# Patient Record
Sex: Male | Born: 1970 | Race: Black or African American | Hispanic: No | Marital: Married | Smoking: Current every day smoker
Health system: Southern US, Community
[De-identification: ages and names within clinical notes are randomized; demographics above are authoritative.]

## PROBLEM LIST (undated history)

## (undated) DIAGNOSIS — T7840XA Allergy, unspecified, initial encounter: Secondary | ICD-10-CM

## (undated) HISTORY — DX: Allergy, unspecified, initial encounter: T78.40XA

## (undated) HISTORY — PX: TESTICLE SURGERY: SHX794

---

## 2008-04-07 ENCOUNTER — Ambulatory Visit: Payer: Self-pay | Admitting: Emergency Medicine

## 2008-04-07 ENCOUNTER — Emergency Department: Payer: Self-pay | Admitting: Emergency Medicine

## 2008-04-11 ENCOUNTER — Ambulatory Visit: Payer: Self-pay | Admitting: Vascular Surgery

## 2008-04-18 ENCOUNTER — Ambulatory Visit: Payer: Self-pay | Admitting: Vascular Surgery

## 2011-08-30 HISTORY — PX: GALLBLADDER SURGERY: SHX652

## 2012-04-10 ENCOUNTER — Ambulatory Visit: Payer: Self-pay | Admitting: Internal Medicine

## 2012-08-06 ENCOUNTER — Ambulatory Visit: Payer: Self-pay | Admitting: Internal Medicine

## 2014-05-17 IMAGING — CR DG CHEST 2V
1 series · 2 of 2 positions shown · non-contrast
Comparison: none

REASON FOR EXAM: chest pain
COMMENTS:

PROCEDURE:     DXR - DXR CHEST PA (OR AP) AND LATERAL  - August 06, 2012 [DATE]
RESULT:     The lungs are clear. The heart and pulmonary vessels are normal.
The bony and mediastinal structures are unremarkable. There is no effusion.
There is no pneumothorax or evidence of congestive failure.

[Series 1: w chest pa · 0.14mm/px · 2 of 2 slices shown]
[im 1/2]
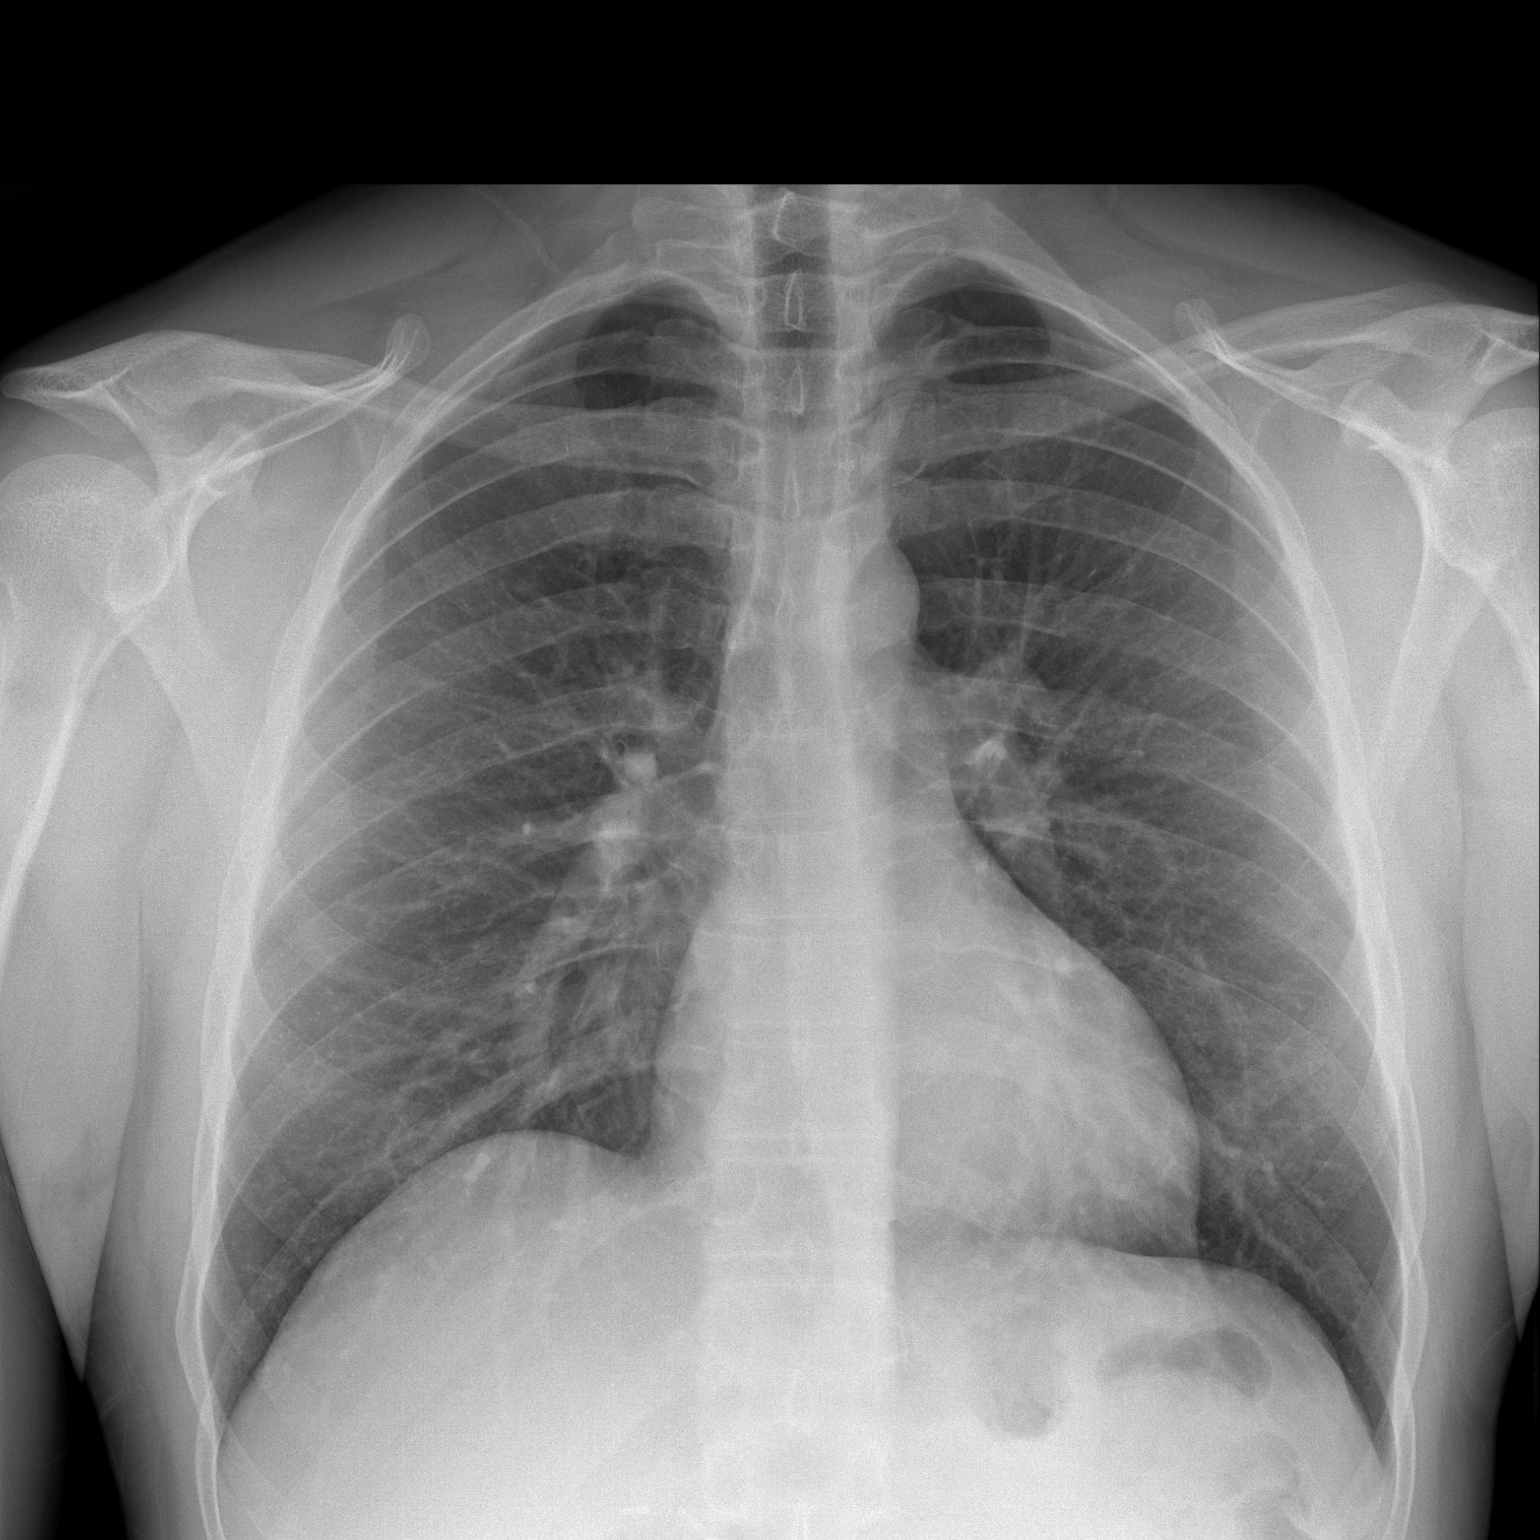
[im 2/2]
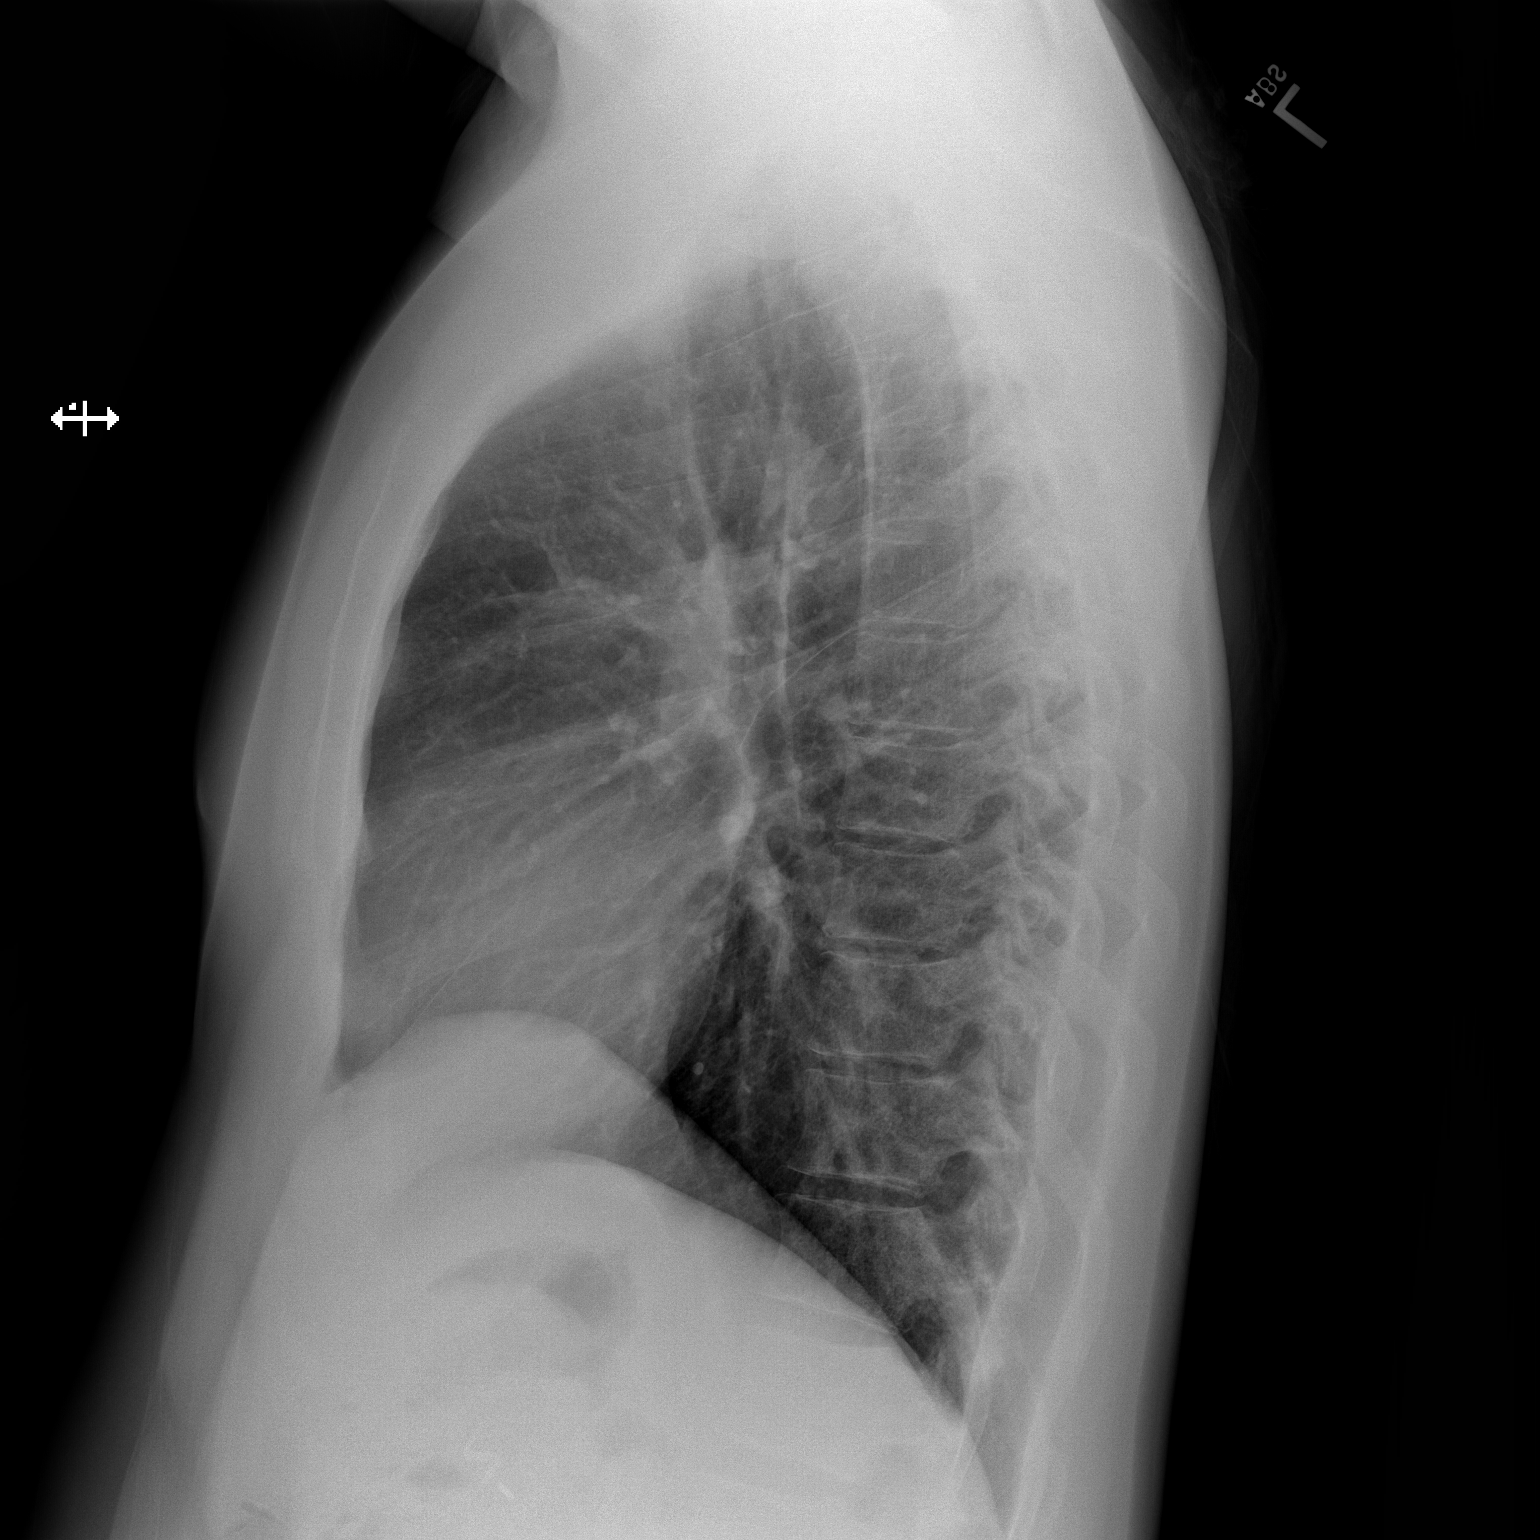

[2 of 2 positions shown; findings below may reference images not displayed]

IMPRESSION: No acute cardiopulmonary disease. Stable appearance
compared to 10 April, 2012.

[REDACTED]

## 2017-02-15 ENCOUNTER — Ambulatory Visit (INDEPENDENT_AMBULATORY_CARE_PROVIDER_SITE_OTHER): Payer: BLUE CROSS/BLUE SHIELD | Admitting: Family Medicine

## 2017-02-15 ENCOUNTER — Encounter: Payer: Self-pay | Admitting: Family Medicine

## 2017-02-15 DIAGNOSIS — F321 Major depressive disorder, single episode, moderate: Secondary | ICD-10-CM

## 2017-02-15 DIAGNOSIS — F32A Depression, unspecified: Secondary | ICD-10-CM | POA: Insufficient documentation

## 2017-02-15 DIAGNOSIS — F329 Major depressive disorder, single episode, unspecified: Secondary | ICD-10-CM | POA: Insufficient documentation

## 2017-02-15 MED ORDER — SERTRALINE HCL 50 MG PO TABS: ORAL_TABLET | ORAL | 0 refills | Status: DC

## 2017-02-15 NOTE — Progress Notes (Signed)
Patient: Kenneth Lowery. Male    DOB: 1971/01/06   46 y.o.   MRN: [id number] Visit Date: 02/15/2017  Today's Provider: Mila Merry, MD   Chief Complaint  Patient presents with  . Depression   Subjective:    Patient is here to discuss his symptoms of depression.  He states that every once in a while he has periods where he doesn't feel like wanting to live. He had once of these periods about two weeks ago and became very concerned. He did not formulate a specific plan but thought through the consequences such as the reaction of his family members finding his bodies. He doesn't usually feel depressed but sometimes feels like he goes through a funk where he feels sad and teary. He states he saw a counselor about this a few days ago and was advised to come in and talk about medications for depression. He states he had an aunt who attempted suicide and thinks that his grandmother may have been schizophrenic. He does not hear voices or see things that are not present.   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 He feels that everything in his home and work environment are going great with no recent changes.      No Known Allergies   Current Outpatient Prescriptions:  .  Chlorpheniramine Maleate (ALLERGY RELIEF PO), Take by mouth as needed., Disp: , Rfl:   Review of Systems  Constitutional: Negative for appetite change, chills and fever.  Respiratory: Negative for chest tightness, shortness of breath and wheezing.   Cardiovascular: Negative for chest pain and palpitations.  Gastrointestinal: Negative for abdominal pain, nausea and vomiting.    Social History  Substance Use Topics  . Smoking status: Current Every Day Smoker  . Smokeless tobacco: Never Used  . Alcohol use Yes   Objective:   BP 104/70 (BP Location: Right Arm, Patient Position: Sitting, Cuff Size: Large)   Pulse 80   Temp 98.5 F  (36.9 C) (Oral)   Resp 16   Ht 6' 2.5" (1.892 m)   Wt 208 lb (94.3 kg)   SpO2 99%   BMI 26.35 kg/m  Vitals:   02/15/17 0859  BP: 104/70  Pulse: 80  Resp: 16  Temp: 98.5 F (36.9 C)  TempSrc: Oral  SpO2: 99%  Weight: 208 lb (94.3 kg)  Height: 6' 2.5" (1.892 m)   Depression screen [facility] 2/9 02/15/2017  Decreased Interest 1  Down, Depressed, Hopeless 3  PHQ - 2 Score 4  Altered sleeping 1  Tired, decreased energy 0  Change in appetite 1  Feeling bad or failure about yourself  1  Trouble concentrating 0  Moving slowly or fidgety/restless 2  Suicidal thoughts 1  PHQ-9 Score 10  Difficult doing work/chores Somewhat difficult     Physical Exam  General appearance: alert, well developed, well nourished, cooperative and in no distress Head: Normocephalic, without obvious abnormality, atraumatic Respiratory: Respirations even and unlabored, normal respiratory rate Extremities: No gross deformities Skin: Skin color, texture, turgor normal. No rashes seen  Psych: Appropriate mood and affect. Neurologic: Mental status: Alert, oriented to person, place, and time, thought content appropriate.     Assessment & Plan:     1. Current moderate episode of major depressive disorder without prior episode [facility]) Not actively suicidal. Is seeing counselor and anticipates follow up after starting medications. Start sertraline and schedule  follow up here in 2-3 weeks.        Mila Merryonald Blayton Huttner, MD  [facility]

## 2017-03-13 ENCOUNTER — Encounter: Payer: Self-pay | Admitting: Family Medicine

## 2017-03-13 ENCOUNTER — Ambulatory Visit (INDEPENDENT_AMBULATORY_CARE_PROVIDER_SITE_OTHER): Payer: BLUE CROSS/BLUE SHIELD | Admitting: Family Medicine

## 2017-03-13 ENCOUNTER — Other Ambulatory Visit: Payer: Self-pay | Admitting: Family Medicine

## 2017-03-13 VITALS — BP 120/78 | HR 74 | Temp 98.9°F | Resp 16 | Wt 200.0 lb

## 2017-03-13 DIAGNOSIS — Z23 Encounter for immunization: Secondary | ICD-10-CM | POA: Diagnosis not present

## 2017-03-13 DIAGNOSIS — F321 Major depressive disorder, single episode, moderate: Secondary | ICD-10-CM

## 2017-03-13 MED ORDER — SERTRALINE HCL 50 MG PO TABS: 50.0000 mg | ORAL_TABLET | Freq: Every day | ORAL | 3 refills | Status: DC

## 2017-03-13 MED ORDER — SERTRALINE HCL 50 MG PO TABS: 50.0000 mg | ORAL_TABLET | Freq: Every day | ORAL | 6 refills | Status: DC

## 2017-03-13 NOTE — Progress Notes (Signed)
       Patient: Kenneth Leydendmond C Bahl Jr. Male    DOB: [birth year]   46 y.o.   MRN: [id number] Visit Date: 03/13/2017  Today's Provider: Mila Merryonald Tajanay Hurley, MD   Chief Complaint  Patient presents with  . Depression    3 week follow up   Subjective:    HPI Follow up of Depression: Patient was last seen for this problem 3 weeks ago. Management during that visit includes starting patient on Sertraline and advising patient to follow up in 2-3 weeks. Today patient reports good compliance with treatment, good tolerance and good symptom control.     No Known Allergies   Current Outpatient Prescriptions:  .  loratadine (CLARITIN) 10 MG tablet, Take 10 mg by mouth daily., Disp: , Rfl:  .  sertraline (ZOLOFT) 50 MG tablet, 1/2 tablet daily for six days, then increase to 1 tablet daily, Disp: 30 tablet, Rfl: 0  Review of Systems  Constitutional: Negative for appetite change, chills and fever.  Respiratory: Negative for chest tightness, shortness of breath and wheezing.   Cardiovascular: Negative for chest pain and palpitations.  Gastrointestinal: Negative for abdominal pain, nausea and vomiting.    Social History  Substance Use Topics  . Smoking status: Current Every Day Smoker    Packs/day: 1.00  . Smokeless tobacco: Never Used  . Alcohol use 3.6 - 7.2 oz/week    6 - 12 Cans of beer per week   Objective:   BP 120/78 (BP Location: Left Arm, Patient Position: Sitting, Cuff Size: Normal)   Pulse 74   Temp 98.9 F (37.2 C) (Oral)   Resp 16   Wt 200 lb (90.7 kg)   SpO2 98% Comment: room air  BMI 25.33 kg/m  There were no vitals filed for this visit.   Depression screen PHQ 2/9 03/13/2017  Decreased Interest 1  Down, Depressed, Hopeless 0  PHQ - 2 Score 1  Altered sleeping 2  Tired, decreased energy 1  Change in appetite 0  Feeling bad or failure about yourself  0  Trouble concentrating 0  Moving slowly or fidgety/restless 0  Suicidal thoughts 0  PHQ-9 Score 4  Difficult doing  work/chores Not difficult at all      Physical Exam   General appearance: alert, well developed, well nourished, cooperative and in no distress Head: Normocephalic, without obvious abnormality, atraumatic Respiratory: Respirations even and unlabored, normal respiratory rate Extremities: No gross deformities Skin: Skin color, texture, turgor normal. No rashes seen  Psych: Appropriate mood and affect. Neurologic: Mental status: Alert, oriented to person, place, and time, thought content appropriate.     Assessment & Plan:     1. Current moderate episode of major depressive disorder without prior episode (HCC) Doing well with sertraline. Continue current medications.  Follow up 6 months.   2. Need for tetanus, diphtheria, and acellular pertussis (Tdap) vaccine  - Tdap vaccine greater than or equal to 7yo IM       Mila Merryonald Jonelle Bann, MD  [facility]

## 2017-03-13 NOTE — Telephone Encounter (Signed)
[facility] faxed a request for a 90-days supply on the following medication. Thanks CC  sertraline (ZOLOFT) 50 MG tablet

## 2017-09-18 ENCOUNTER — Ambulatory Visit: Payer: Self-pay | Admitting: Family Medicine

## 2017-09-25 ENCOUNTER — Encounter: Payer: Self-pay | Admitting: Family Medicine

## 2017-09-25 ENCOUNTER — Ambulatory Visit (INDEPENDENT_AMBULATORY_CARE_PROVIDER_SITE_OTHER): Payer: BLUE CROSS/BLUE SHIELD | Admitting: Family Medicine

## 2017-09-25 VITALS — BP 110/64 | HR 81 | Temp 98.1°F | Resp 16 | Ht 75.0 in | Wt 204.0 lb

## 2017-09-25 DIAGNOSIS — F321 Major depressive disorder, single episode, moderate: Secondary | ICD-10-CM | POA: Diagnosis not present

## 2017-09-25 NOTE — Progress Notes (Signed)
       Patient: Kenneth Leydendmond C Knick Jr. Male    DOB: 1971-04-05   47 y.o.   MRN: [id number] Visit Date: 09/25/2017  Today's Provider: Mila Merryonald Fisher, MD   Chief Complaint  Patient presents with  . Follow-up  . Depression   Subjective:    HPI  Depression Since last visit has been doing fairly well but having some trouble sleeping  Feels his mood is pretty good most of the time. Gets a little withdrawn occasionally, but feels is doing much better since being on sertraline. Is not having any adverse effects.   No Known Allergies   Current Outpatient Medications:  .  loratadine (CLARITIN) 10 MG tablet, Take 10 mg by mouth daily., Disp: , Rfl:  .  sertraline (ZOLOFT) 50 MG tablet, Take 1 tablet (50 mg total) by mouth daily., Disp: 90 tablet, Rfl: 3  Review of Systems  Constitutional: Negative for appetite change, chills and fever.  Respiratory: Negative for chest tightness, shortness of breath and wheezing.   Cardiovascular: Negative for chest pain and palpitations.  Gastrointestinal: Negative for abdominal pain, nausea and vomiting.    Social History   Tobacco Use  . Smoking status: Current Every Day Smoker    Packs/day: 1.00  . Smokeless tobacco: Never Used  Substance Use Topics  . Alcohol use: Yes    Alcohol/week: 3.6 - 7.2 oz    Types: 6 - 12 Cans of beer per week   Objective:   BP 110/64 (BP Location: Right Arm, Patient Position: Sitting, Cuff Size: Large)   Pulse 81   Temp 98.1 F (36.7 C) (Oral)   Resp 16   Ht 6\' 3"  (1.905 m)   Wt 204 lb (92.5 kg)   SpO2 99%   BMI 25.50 kg/m  Vitals:   09/25/17 1441  BP: 110/64  Pulse: 81  Resp: 16  Temp: 98.1 F (36.7 C)  TempSrc: Oral  SpO2: 99%  Weight: 204 lb (92.5 kg)  Height: 6\' 3"  (1.905 m)     Physical Exam  General appearance: alert, well developed, well nourished, cooperative and in no distress Head: Normocephalic, without obvious abnormality, atraumatic Respiratory: Respirations even and unlabored,  normal respiratory rate Extremities: No gross deformities Skin: Skin color, texture, turgor normal. No rashes seen  Psych: Appropriate mood and affect. Neurologic: Mental status: Alert, oriented to person, place, and time, thought content appropriate.     Assessment & Plan:     1. Current moderate episode of major depressive disorder without prior episode (HCC) Doing well on current dose of sertraline. Continue current medications.   Follow up and CPE in 6 months.        Mila Merryonald Fisher, MD  [facility]

## 2017-11-16 ENCOUNTER — Other Ambulatory Visit: Payer: Self-pay | Admitting: Family Medicine

## 2017-11-16 NOTE — Telephone Encounter (Signed)
[facility] for a 90-days supply for the following medication. Thanks CC ° °sertraline (ZOLOFT) 50 MG tablet  ° °

## 2017-11-17 MED ORDER — SERTRALINE HCL 50 MG PO TABS: 50.0000 mg | ORAL_TABLET | Freq: Every day | ORAL | 3 refills | Status: DC

## 2018-03-02 ENCOUNTER — Ambulatory Visit: Payer: Self-pay | Admitting: Family Medicine

## 2018-03-07 ENCOUNTER — Encounter: Payer: Self-pay | Admitting: Family Medicine

## 2018-03-07 ENCOUNTER — Ambulatory Visit (INDEPENDENT_AMBULATORY_CARE_PROVIDER_SITE_OTHER): Payer: BLUE CROSS/BLUE SHIELD | Admitting: Family Medicine

## 2018-03-07 VITALS — BP 140/80 | HR 74 | Temp 98.6°F | Resp 16 | Wt 205.4 lb

## 2018-03-07 DIAGNOSIS — Z113 Encounter for screening for infections with a predominantly sexual mode of transmission: Secondary | ICD-10-CM | POA: Diagnosis not present

## 2018-03-07 DIAGNOSIS — Z13220 Encounter for screening for lipoid disorders: Secondary | ICD-10-CM

## 2018-03-07 DIAGNOSIS — Z13228 Encounter for screening for other metabolic disorders: Secondary | ICD-10-CM

## 2018-03-07 NOTE — Progress Notes (Signed)
       Patient: Kenneth Leydendmond C Joles Jr. Male    DOB: 08-Mar-1971   47 y.o.   MRN: [id number] Visit Date: 03/07/2018  Today's Provider: Mila Merryonald Fisher, MD   Chief Complaint  Patient presents with  . Exposure to STD   Subjective:    HPI Sexually Transmitted Disease Check: Patient presents for sexually transmitted disease check. Sexual history reviewed with the patient. STD exposure: He reports partner had blood test done Positive for HSV and he is concerned he may have it.   Previous history of STD:  He denies any dysuria, discharge, or genital or oral lesions.  Contraception: vasectomy.    No Known Allergies   Current Outpatient Medications:  .  loratadine (CLARITIN) 10 MG tablet, Take 10 mg by mouth daily., Disp: , Rfl:  .  sertraline (ZOLOFT) 50 MG tablet, Take 1 tablet (50 mg total) by mouth daily., Disp: 90 tablet, Rfl: 3  Review of Systems  Cardiovascular: Negative for chest pain, palpitations and leg swelling.    Social History   Tobacco Use  . Smoking status: Current Every Day Smoker    Packs/day: 1.00  . Smokeless tobacco: Never Used  Substance Use Topics  . Alcohol use: Yes    Alcohol/week: 3.6 - 7.2 oz    Types: 6 - 12 Cans of beer per week   Objective:   BP 140/80 (BP Location: Right Arm, Patient Position: Sitting, Cuff Size: Large) Comment: "nervous"  Pulse 74   Temp 98.6 F (37 C) (Oral)   Resp 16   Wt 205 lb 6.4 oz (93.2 kg)   SpO2 99%   BMI 25.67 kg/m  Vitals:   03/07/18 1441  BP: 140/80  Pulse: 74  Resp: 16  Temp: 98.6 F (37 C)  TempSrc: Oral  SpO2: 99%  Weight: 205 lb 6.4 oz (93.2 kg)     Physical Exam  General appearance: alert, well developed, well nourished, cooperative and in no distress Head: Normocephalic, without obvious abnormality, atraumatic Respiratory: Respirations even and unlabored, normal respiratory rate Extremities: No gross deformities Skin: Skin color, texture, turgor normal. No rashes seen  Psych: Appropriate mood  and affect. Neurologic: Mental status: Alert, oriented to person, place, and time, thought content appropriate.     Assessment & Plan:     1. Screening examination for STD (sexually transmitted disease) Counseled on high prevalence of hxv I and II and hsv I is historically associated with oral lesions and I with genital lesions, although there are now fairly common exceptions. Considering no current or previous symptoms that no treatment is necessary for a positive antibiotic test, but prophylaxis might be worth considering if hsv II is positive.  - HIV antibody (with reflex) - RPR - HSV(herpes simplex vrs) 1+2 ab-IgG - GC/Chlamydia Probe Amp([facility])  2. Lipid screening - Lipid panel  3. Screening for metabolic disorder  - Comprehensive metabolic panel       Mila Merryonald Fisher, MD  [facility]

## 2018-03-08 LAB — LIPID PANEL
CHOL/HDL RATIO: 4.2 ratio (ref 0.0–5.0)
Cholesterol, Total: 190 mg/dL (ref 100–199)
HDL: 45 mg/dL (ref >39)
LDL CALC: 122 mg/dL — AB (ref 0–99)
Triglycerides: 114 mg/dL (ref 0–149)
VLDL Cholesterol Cal: 23 mg/dL (ref 5–40)

## 2018-03-08 LAB — COMPREHENSIVE METABOLIC PANEL
A/G RATIO: 1.5 (ref 1.2–2.2)
ALK PHOS: 79 IU/L (ref 39–117)
ALT: 12 IU/L (ref 0–44)
AST: 12 IU/L (ref 0–40)
Albumin: 4.2 g/dL (ref 3.5–5.5)
BUN/Creatinine Ratio: 12 (ref 9–20)
BUN: 11 mg/dL (ref 6–24)
CHLORIDE: 101 mmol/L (ref 96–106)
CO2: 27 mmol/L (ref 20–29)
Calcium: 9.9 mg/dL (ref 8.7–10.2)
Creatinine, Ser: 0.91 mg/dL (ref 0.76–1.27)
GFR calc Af Amer: 118 mL/min/{1.73_m2} (ref >59)
GFR calc non Af Amer: 102 mL/min/{1.73_m2} (ref >59)
GLOBULIN, TOTAL: 2.8 g/dL (ref 1.5–4.5)
Glucose: 90 mg/dL (ref 65–99)
Potassium: 4.4 mmol/L (ref 3.5–5.2)
SODIUM: 141 mmol/L (ref 134–144)
Total Protein: 7 g/dL (ref 6.0–8.5)

## 2018-03-08 LAB — RPR: RPR: NONREACTIVE

## 2018-03-08 LAB — HSV(HERPES SIMPLEX VRS) I + II AB-IGG: HSV 1 GLYCOPROTEIN G AB, IGG: 1.26 {index} — AB (ref 0.00–0.90)

## 2018-03-08 LAB — HIV ANTIBODY (ROUTINE TESTING W REFLEX): HIV SCREEN 4TH GENERATION: NONREACTIVE

## 2018-03-09 LAB — GC/CHLAMYDIA PROBE AMP
CHLAMYDIA, DNA PROBE: NEGATIVE
Neisseria gonorrhoeae by PCR: NEGATIVE

## 2018-03-26 ENCOUNTER — Encounter: Payer: Self-pay | Admitting: Family Medicine

## 2018-04-04 ENCOUNTER — Ambulatory Visit (INDEPENDENT_AMBULATORY_CARE_PROVIDER_SITE_OTHER): Payer: BLUE CROSS/BLUE SHIELD | Admitting: Family Medicine

## 2018-04-04 ENCOUNTER — Encounter: Payer: Self-pay | Admitting: Family Medicine

## 2018-04-04 VITALS — BP 110/82 | HR 73 | Temp 98.8°F | Resp 15 | Ht 74.75 in | Wt 205.2 lb

## 2018-04-04 DIAGNOSIS — F321 Major depressive disorder, single episode, moderate: Secondary | ICD-10-CM

## 2018-04-04 DIAGNOSIS — Z8619 Personal history of other infectious and parasitic diseases: Secondary | ICD-10-CM | POA: Insufficient documentation

## 2018-04-04 DIAGNOSIS — Z0000 Encounter for general adult medical examination without abnormal findings: Secondary | ICD-10-CM | POA: Diagnosis not present

## 2018-04-04 DIAGNOSIS — Z720 Tobacco use: Secondary | ICD-10-CM | POA: Diagnosis not present

## 2018-04-04 MED ORDER — BUPROPION HCL ER (SR) 100 MG PO TB12: 100.0000 mg | ORAL_TABLET | Freq: Two times a day (BID) | ORAL | 3 refills | Status: DC

## 2018-04-04 NOTE — Patient Instructions (Addendum)
Please stop smoking   Preventive Care 40-64 Years, Male Preventive care refers to lifestyle choices and visits with your health care provider that can promote health and wellness. What does preventive care include?  A yearly physical exam. This is also called an annual well check.  Dental exams once or twice a year.  Routine eye exams. Ask your health care provider how often you should have your eyes checked.  Personal lifestyle choices, including: ? Daily care of your teeth and gums. ? Regular physical activity. ? Eating a healthy diet. ? Avoiding tobacco and drug use. ? Limiting alcohol use. ? Practicing safe sex. ? Taking low-dose aspirin every day starting at age 45. What happens during an annual well check? The services and screenings done by your health care provider during your annual well check will depend on your age, overall health, lifestyle risk factors, and family history of disease. Counseling Your health care provider may ask you questions about your:  Alcohol use.  Tobacco use.  Drug use.  Emotional well-being.  Home and relationship well-being.  Sexual activity.  Eating habits.  Work and work Statistician.  Screening You may have the following tests or measurements:  Height, weight, and BMI.  Blood pressure.  Lipid and cholesterol levels. These may be checked every 5 years, or more frequently if you are over 67 years old.  Skin check.  Lung cancer screening. You may have this screening every year starting at age 45 if you have a 30-pack-year history of smoking and currently smoke or have quit within the past 15 years.  Fecal occult blood test (FOBT) of the stool. You may have this test every year starting at age 45.  Flexible sigmoidoscopy or colonoscopy. You may have a sigmoidoscopy every 5 years or a colonoscopy every 10 years starting at age 45.  Prostate cancer screening. Recommendations will vary depending on your family history and  other risks.  Hepatitis C blood test.  Hepatitis B blood test.  Sexually transmitted disease (STD) testing.  Diabetes screening. This is done by checking your blood sugar (glucose) after you have not eaten for a while (fasting). You may have this done every 1-3 years.  Discuss your test results, treatment options, and if necessary, the need for more tests with your health care provider. Vaccines Your health care provider may recommend certain vaccines, such as:  Influenza vaccine. This is recommended every year.  Tetanus, diphtheria, and acellular pertussis (Tdap, Td) vaccine. You may need a Td booster every 10 years.  Varicella vaccine. You may need this if you have not been vaccinated.  Zoster vaccine. You may need this after age 45.  Measles, mumps, and rubella (MMR) vaccine. You may need at least one dose of MMR if you were born in 1957 or later. You may also need a second dose.  Pneumococcal 13-valent conjugate (PCV13) vaccine. You may need this if you have certain conditions and have not been vaccinated.  Pneumococcal polysaccharide (PPSV23) vaccine. You may need one or two doses if you smoke cigarettes or if you have certain conditions.  Meningococcal vaccine. You may need this if you have certain conditions.  Hepatitis A vaccine. You may need this if you have certain conditions or if you travel or work in places where you may be exposed to hepatitis A.  Hepatitis B vaccine. You may need this if you have certain conditions or if you travel or work in places where you may be exposed to hepatitis B.  Haemophilus influenzae type b (Hib) vaccine. You may need this if you have certain risk factors.  Talk to your health care provider about which screenings and vaccines you need and how often you need them. This information is not intended to replace advice given to you by your health care provider. Make sure you discuss any questions you have with your health care  provider. Document Released: 09/11/2015 Document Revised: 05/04/2016 Document Reviewed: 06/16/2015 Elsevier Interactive Patient Education  [employer].

## 2018-04-04 NOTE — Progress Notes (Signed)
Patient: Kenneth Lowery., Male    DOB: [birth year], 47 y.o.   MRN: [id number] Visit Date: 04/04/2018  Today's Provider: Mila Merry, MD   Chief Complaint  Patient presents with  . Annual Exam   Subjective:    Annual physical exam Kenneth Griep. is a 47 y.o. male who presents today for health maintenance and complete physical. He feels well. He reports not actively exercising . He reports he is sleeping poorly on average patient does sleep 6hrs a night.  -----------------------------------------------------------------  Current moderate episode of major depressive disorder without prior episode (HCC) From 09/25/2017-no changes. Doing well on current dose of sertraline.    He does drink 3-4 alcoholic drinks most days and has trouble sleeping when he stops. He also continue to smoke 1 ppd and vapes as an attempt to cut back on cigarettes. He states he tried Chantix in the past which didn't really help and caused vivid dreams.   Results for orders placed or performed in visit on 03/07/18  Comprehensive metabolic panel  Result Value Ref Range   Glucose 90 65 - 99 mg/dL   BUN 11 6 - 24 mg/dL   Creatinine, Ser 6.57 0.76 - 1.27 mg/dL   GFR calc non Af Amer 102 >59 mL/min/1.73   GFR calc Af Amer 118 >59 mL/min/1.73   BUN/Creatinine Ratio 12 9 - 20   Sodium 141 134 - 144 mmol/L   Potassium 4.4 3.5 - 5.2 mmol/L   Chloride 101 96 - 106 mmol/L   CO2 27 20 - 29 mmol/L   Calcium 9.9 8.7 - 10.2 mg/dL   Total Protein 7.0 6.0 - 8.5 g/dL   Albumin 4.2 3.5 - 5.5 g/dL   Globulin, Total 2.8 1.5 - 4.5 g/dL   Albumin/Globulin Ratio 1.5 1.2 - 2.2   Bilirubin Total <0.2 0.0 - 1.2 mg/dL   Alkaline Phosphatase 79 39 - 117 IU/L   AST 12 0 - 40 IU/L   ALT 12 0 - 44 IU/L  Lipid panel  Result Value Ref Range   Cholesterol, Total 190 100 - 199 mg/dL   Triglycerides 846 0 - 149 mg/dL   HDL 45 >96 mg/dL   VLDL Cholesterol Cal 23 5 - 40 mg/dL   LDL Calculated 295 (H) 0 - 99 mg/dL     Chol/HDL Ratio 4.2 0.0 - 5.0 ratio       Review of Systems  Constitutional: Negative.   HENT: Negative.   Eyes: Negative.   Respiratory: Negative.   Cardiovascular: Negative.   Gastrointestinal: Negative.   Endocrine: Negative.   Genitourinary: Negative.   Musculoskeletal: Positive for arthralgias and neck stiffness.  Skin: Negative.   Allergic/Immunologic: Negative.   Neurological: Negative.   Hematological: Negative.   Psychiatric/Behavioral: Positive for sleep disturbance. The patient is nervous/anxious.     Social History      He  reports that he has been smoking.  He has been smoking about 1.00 pack per day. He has never used smokeless tobacco. He reports that he drinks about 3.6 - 7.2 oz of alcohol per week. He reports that he does not use drugs.       Social History   Socioeconomic History  . Marital status: Married    Spouse name: Not on file  . Number of children: Not on file  . Years of education: Not on file  . Highest education level: Not on file  Occupational History  . Occupation: employed  Social Needs  . Financial resource strain: Not on file  . Food insecurity:    Worry: Not on file    Inability: Not on file  . Transportation needs:    Medical: Not on file    Non-medical: Not on file  Tobacco Use  . Smoking status: Current Every Day Smoker    Packs/day: 1.00  . Smokeless tobacco: Never Used  Substance and Sexual Activity  . Alcohol use: Yes    Alcohol/week: 3.6 - 7.2 oz    Types: 6 - 12 Cans of beer per week  . Drug use: No  . Sexual activity: Not on file  Lifestyle  . Physical activity:    Days per week: Not on file    Minutes per session: Not on file  . Stress: Not on file  Relationships  . Social connections:    Talks on phone: Not on file    Gets together: Not on file    Attends religious service: Not on file    Active member of club or organization: Not on file    Attends meetings of clubs or organizations: Not on file     Relationship status: Not on file  Other Topics Concern  . Not on file  Social History Narrative  . Not on file    Past Medical History:  Diagnosis Date  . Allergy      Patient Active Problem List   Diagnosis Date Noted  . NO HISTORY OF CHICKEN POX 04/04/2018  . Depression 02/15/2017    Past Surgical History:  Procedure Laterality Date  . GALLBLADDER SURGERY  2013  . TESTICLE SURGERY      Family History        Family Status  Relation Name Status  . Mother  Alive  . Other  Other       grandparents history, patient did not specify which grandparent had which illness.        His family history includes Cancer in his mother and other; Diabetes in his other; Mental illness in his other; Stroke in his other.      Allergies  Allergen Reactions  . Other Hives     Current Outpatient Medications:  .  loratadine (CLARITIN) 10 MG tablet, Take 10 mg by mouth daily., Disp: , Rfl:  .  sertraline (ZOLOFT) 50 MG tablet, Take 1 tablet (50 mg total) by mouth daily., Disp: 90 tablet, Rfl: 3   Patient Care Team: Malva LimesFisher, Donald E, MD as PCP - General (Family Medicine)      Objective:   Vitals: BP 110/82   Pulse 73   Temp 98.8 F (37.1 C) (Oral)   Resp 15   Ht 6' 2.75" (1.899 m)   Wt 205 lb 3.2 oz (93.1 kg)   SpO2 99%   BMI 25.82 kg/m    Vitals:   04/04/18 1406  BP: 110/82  Pulse: 73  Resp: 15  Temp: 98.8 F (37.1 C)  TempSrc: Oral  SpO2: 99%  Weight: 205 lb 3.2 oz (93.1 kg)  Height: 6' 2.75" (1.899 m)     Physical Exam   General Appearance:    Alert, cooperative, no distress, appears stated age  Head:    Normocephalic, without obvious abnormality, atraumatic  Eyes:    PERRL, conjunctiva/corneas clear, EOM's intact, fundi    benign, both eyes       Ears:    Normal TM's and external ear canals, both ears  Nose:   Nares normal, septum midline,  mucosa normal, no drainage   or sinus tenderness  Throat:   Lips, mucosa, and tongue normal; teeth and gums normal    Neck:   Supple, symmetrical, trachea midline, no adenopathy;       thyroid:  No enlargement/tenderness/nodules; no carotid   bruit or JVD  Back:     Symmetric, no curvature, ROM normal, no CVA tenderness  Lungs:     Clear to auscultation bilaterally, respirations unlabored  Chest wall:    No tenderness or deformity  Heart:    Regular rate and rhythm, S1 and S2 normal, no murmur, rub   or gallop  Abdomen:     Soft, non-tender, bowel sounds active all four quadrants,    no masses, no organomegaly  Genitalia:    deferred  Rectal:    deferred  Extremities:   Extremities normal, atraumatic, no cyanosis or edema  Pulses:   2+ and symmetric all extremities  Skin:   Skin color, texture, turgor normal, no rashes or lesions  Lymph nodes:   Cervical, supraclavicular, and axillary nodes normal  Neurologic:   CNII-XII intact. Normal strength, sensation and reflexes      throughout     Depression Screen PHQ 2/9 Scores 04/04/2018 03/13/2017 03/13/2017 02/15/2017  PHQ - 2 Score 2 1 1 4   PHQ- 9 Score 8 4 - 10      Assessment & Plan:     Routine Health Maintenance and Physical Exam  Exercise Activities and Dietary recommendations Goals    None      Immunization History  Administered Date(s) Administered  . Tdap 03/13/2017    Health Maintenance  Topic Date Due  . INFLUENZA VACCINE  03/29/2018  . TETANUS/TDAP  03/14/2027  . HIV Screening  Completed     Discussed health benefits of physical activity, and encouraged him to engage in regular exercise appropriate for his age and condition.    --------------------------------------------------------------------  1. Annual physical exam   2. Current moderate episode of major depressive disorder without prior episode (HCC) Doing well with sertraline.   3. Tobacco use Did not have success with chantix. He states he is not ready to quit yet, but accepted printed prescription for - buPROPion (WELLBUTRIN SR) 100 MG 12 hr tablet; Take 1  tablet (100 mg total) by mouth 2 (two) times daily.  Dispense: 60 tablet; Refill: 3 Counseled on health benefits of smoking cessation and the sooner he quits the better.    Mila Merry, MD  [facility]

## 2018-09-18 ENCOUNTER — Encounter: Payer: Self-pay | Admitting: Family Medicine

## 2018-09-18 ENCOUNTER — Ambulatory Visit (INDEPENDENT_AMBULATORY_CARE_PROVIDER_SITE_OTHER): Payer: BLUE CROSS/BLUE SHIELD | Admitting: Family Medicine

## 2018-09-18 VITALS — BP 120/70 | HR 100 | Temp 99.6°F | Resp 16 | Wt 210.2 lb

## 2018-09-18 DIAGNOSIS — Z20828 Contact with and (suspected) exposure to other viral communicable diseases: Secondary | ICD-10-CM

## 2018-09-18 DIAGNOSIS — B349 Viral infection, unspecified: Secondary | ICD-10-CM | POA: Diagnosis not present

## 2018-09-18 MED ORDER — HYDROCODONE-HOMATROPINE 5-1.5 MG/5ML PO SYRP: ORAL_SOLUTION | ORAL | 0 refills | Status: DC

## 2018-09-18 MED ORDER — OSELTAMIVIR PHOSPHATE 75 MG PO CAPS: 75.0000 mg | ORAL_CAPSULE | Freq: Two times a day (BID) | ORAL | 0 refills | Status: DC

## 2018-09-18 NOTE — Progress Notes (Signed)
  Subjective:     Patient ID: Kenneth Lowery., male   DOB: December 11, 1970, 48 y.o.   MRN: [id number] Chief Complaint  Patient presents with  . Influenza    Patient comes in office today with concerns of exposure to flu virus. Patient states that 2 days ago his child was diagnosed with both strains of flu and reports that this morning he woke up with a fever of 101. Patient reports today feeling fatigued and drained,patient did not recieve his flu vaccine this past year.    HPI Reports cough, body aches, sore throat, nd headache. No flu shot this season.  Review of Systems     Objective:   Physical Exam Constitutional:      General: He is not in acute distress.    Appearance: He is ill-appearing ( lying on exam table on presentation).  Neurological:     Mental Status: He is alert.   Ears: T.M's intact without inflammation Throat: no tonsillar enlargement or exudate Neck: no cervical adenopathy Lungs: clear     Assessment:    1. Acute viral syndrome; Rx for hydrocodone cough syrup  2. Exposure to influenza: rx for Tamiflu    Plan:    Discussed use of Mucinex D and Deslym Work excuse for 1/21-1/24/20.

## 2018-09-18 NOTE — Patient Instructions (Addendum)
Discussed use of Mucinex D for congestion. May also use Delysm for cough. Advil or Aleve for body aches.

## 2018-11-18 ENCOUNTER — Other Ambulatory Visit: Payer: Self-pay | Admitting: Family Medicine

## 2019-11-29 ENCOUNTER — Other Ambulatory Visit: Payer: Self-pay | Admitting: Family Medicine

## 2019-11-29 ENCOUNTER — Telehealth: Payer: Self-pay | Admitting: Family Medicine

## 2019-11-29 NOTE — Telephone Encounter (Signed)
Courtesy refill Patient needs appointment  

## 2019-11-29 NOTE — Telephone Encounter (Signed)
Attempted to contact patient to schedule appointment for medication refill. Left VM to call office to schedule.

## 2019-12-26 ENCOUNTER — Other Ambulatory Visit: Payer: Self-pay | Admitting: Family Medicine

## 2019-12-26 NOTE — Telephone Encounter (Signed)
Requested medication (s) are due for refill today -yes  Requested medication (s) are on the active medication list -yes  Future visit scheduled -no  Last refill: 11/29/19  Notes to clinic: Attempted to call patient - mailbox full and can not leave message- may need letter. Patient was given courtesy with last RF- sent for review  Requested Prescriptions  Pending Prescriptions Disp Refills   sertraline (ZOLOFT) 50 MG tablet [Pharmacy Med Name: SERTRALINE HCL 50 MG TABLET] 30 tablet 0    Sig: TAKE 1 TABLET BY MOUTH EVERY DAY      Psychiatry:  Antidepressants - SSRI Failed - 12/26/2019  8:16 AM      Failed - Completed PHQ-2 or PHQ-9 in the last 360 days.      Failed - Valid encounter within last 6 months    Recent Outpatient Visits           1 year ago Acute viral syndrome   [facility] [place], [place], [place]   1 year ago Annual physical exam   [facility] Malva Limes, MD   1 year ago Screening examination for STD (sexually transmitted disease)   [facility] Malva Limes, MD   2 years ago Current moderate episode of major depressive disorder without prior episode [facility])   [facility] Malva Limes, MD   2 years ago Current moderate episode of major depressive disorder without prior episode [facility])   [facility] Malva Limes, MD                  Requested Prescriptions  Pending Prescriptions Disp Refills   sertraline (ZOLOFT) 50 MG tablet [Pharmacy Med Name: SERTRALINE HCL 50 MG TABLET] 30 tablet 0    Sig: TAKE 1 TABLET BY MOUTH EVERY DAY      Psychiatry:  Antidepressants - SSRI Failed - 12/26/2019  8:16 AM      Failed - Completed PHQ-2 or PHQ-9 in the last 360 days.      Failed - Valid encounter within last 6 months    Recent Outpatient Visits           1 year ago Acute viral syndrome   [facility] [place], [place], [place]   1 year ago Annual physical exam   [facility] Malva Limes, MD   1 year ago Screening examination for STD (sexually transmitted disease)   [facility] Malva Limes, MD   2 years ago Current moderate episode of major depressive disorder without prior episode [facility])   [facility] Malva Limes, MD   2 years ago Current moderate episode of major depressive disorder without prior episode [facility])   [facility] Malva Limes, MD

## 2020-01-13 ENCOUNTER — Other Ambulatory Visit: Payer: Self-pay | Admitting: Family Medicine

## 2020-01-13 NOTE — Telephone Encounter (Signed)
Refill request for Sertraline; last refill 11/29/19; no valid encounter within last 6 months; no upcoming visits noted; pt notified;decision tree completed; pt offered and accepted virtual appt for med refills 01/29/20 at 0820; he verbalized understanding; 30 day courtesy refill granted; will route to office for notification. Requested Prescriptions  Pending Prescriptions Disp Refills  . sertraline (ZOLOFT) 50 MG tablet [Pharmacy Med Name: SERTRALINE HCL 50 MG TABLET] 30 tablet 0    Sig: TAKE 1 TABLET BY MOUTH EVERY DAY     Psychiatry:  Antidepressants - SSRI Failed - 01/13/2020  8:00 AM      Failed - Completed PHQ-2 or PHQ-9 in the last 360 days.      Failed - Valid encounter within last 6 months    Recent Outpatient Visits          1 year ago Acute viral syndrome   [facility] [place], [place], [place]   1 year ago Annual physical exam   [facility] Malva Limes, MD   1 year ago Screening examination for STD (sexually transmitted disease)   [facility] Malva Limes, MD   2 years ago Current moderate episode of major depressive disorder without prior episode [facility])   [facility] Malva Limes, MD   2 years ago Current moderate episode of major depressive disorder without prior episode [facility])   [facility] Malva Limes, MD

## 2020-01-15 MED ORDER — SERTRALINE HCL 50 MG PO TABS: 50.0000 mg | ORAL_TABLET | Freq: Every day | ORAL | 0 refills | Status: DC

## 2020-01-15 NOTE — Addendum Note (Signed)
Addended by: Malva Limes on: 01/15/2020 02:46 PM   Modules accepted: Orders

## 2020-01-28 ENCOUNTER — Encounter: Payer: Self-pay | Admitting: Family Medicine

## 2020-01-28 NOTE — Progress Notes (Signed)
MyChart Video Visit    Virtual Visit via Video Note   This visit type was conducted due to national recommendations for restrictions regarding the COVID-19 Pandemic (e.g. social distancing) in an effort to limit this patient's exposure and mitigate transmission in our community. This patient is at least at moderate risk for complications without adequate follow up. This format is felt to be most appropriate for this patient at this time. Physical exam was limited by quality of the video and audio technology used for the visit.   Patient location: home Provider location: b[facility]   Patient: Kenneth Lowery.   DOB: Feb 02, 1971   49 y.o. Male  MRN: [id number] Visit Date: 01/29/2020  Today's healthcare provider: Lelon Huh, MD   Chief Complaint  Patient presents with  . Depression   Subjective    HPI Depression, Follow-up  He  was last seen for this 04/04/2018.  Changes made at last visit include continuing same dose of Sertraline.   He reports good compliance with treatment. He is not having side effects.   He reports good tolerance of treatment. Current symptoms include: depressed mood, difficulty concentrating and fatigue He feels he is Improved since last visit.  Depression screen [facility] 2/9 01/28/2020 04/04/2018 03/13/2017  Decreased Interest 1 1 1   Down, Depressed, Hopeless 1 1 0  PHQ - 2 Score 2 2 1   Altered sleeping 0 1 2  Tired, decreased energy 1 1 1   Change in appetite 1 1 0  Feeling bad or failure about yourself  1 1 0  Trouble concentrating 1 2 0  Moving slowly or fidgety/restless 1 0 0  Suicidal thoughts 1 0 0  PHQ-9 Score 8 8 4   Difficult doing work/chores Somewhat difficult Not difficult at all Not difficult at all    ----------------------------------------------------------------------------------------- He does continue to smoke 1 ppd, although he sometimes changes to vaping during which he doesn't smoke as many cigarettes. He was previously prescribed  bupropion but did not fill prescription. He is interested in trying medication now.   Right eye is watering a lot the last week. Warm compress helps. A little itchy. No other drainage. No other cold sx.    Social History   Tobacco Use  . Smoking status: Current Every Day Smoker    Packs/day: 1.00  . Smokeless tobacco: Never Used  . Tobacco comment: started age 69.  Substance Use Topics  . Alcohol use: Yes    Alcohol/week: 6.0 - 12.0 standard drinks    Types: 6 - 12 Cans of beer per week  . Drug use: No      Medications: Outpatient Medications Prior to Visit  Medication Sig  . loratadine (CLARITIN) 10 MG tablet Take 10 mg by mouth daily.  . sertraline (ZOLOFT) 50 MG tablet Take 1 tablet (50 mg total) by mouth daily.  . [DISCONTINUED] oseltamivir (TAMIFLU) 75 MG capsule Take 1 capsule (75 mg total) by mouth 2 (two) times daily.  Marland Kitchen buPROPion (WELLBUTRIN SR) 100 MG 12 hr tablet Take 1 tablet (100 mg total) by mouth 2 (two) times daily. (Patient not taking: Reported on 01/28/2020)  . [DISCONTINUED] HYDROcodone-homatropine (HYCODAN) 5-1.5 MG/5ML syrup 5 ml 4-6 hours as needed for cough (Patient not taking: Reported on 01/28/2020)   No facility-administered medications prior to visit.    Review of Systems  Constitutional: Positive for fatigue. Negative for appetite change, chills and fever.  Respiratory: Negative for chest tightness, shortness of breath and wheezing.   Cardiovascular: Negative for  chest pain and palpitations.  Gastrointestinal: Negative for abdominal pain, nausea and vomiting.  Psychiatric/Behavioral: Positive for decreased concentration and dysphoric mood.      Objective    There were no vitals taken for this visit.  Physical Exam   Awake, alert, oriented x 3. In no apparent distress   Assessment & Plan     1. Current moderate episode of major depressive disorder without prior episode [facility]) Doing very well current medications. refill - sertraline (ZOLOFT)  50 MG tablet; Take 1 tablet (50 mg total) by mouth daily.  Dispense: 90 tablet; Refill: 3  2. Tobacco use He is now interested in trial of - buPROPion (WELLBUTRIN SR) 150 MG 12 hr tablet; 1 tablet daily for 3 days, then 1 tablet twice daily. Stop smoking 14 days after starting medication  Dispense: 60 tablet; Refill: 5  3. Acute viral conjunctivitis of right eye Is improving with warm compresses. Likely exacerbated by allergies. Advised to to call or send Mychart message if not continuing to improving or not resolved by end of week. If so will consider antibiotic eye drops.    No follow-ups on file.     I discussed the assessment and treatment plan with the patient. The patient was provided an opportunity to ask questions and all were answered. The patient agreed with the plan and demonstrated an understanding of the instructions.   The patient was advised to call back or seek an in-person evaluation if the symptoms worsen or if the condition fails to improve as anticipated.  I provided 12 minutes of non-face-to-face time during this encounter.  The entirety of the information documented in the History of Present Illness, Review of Systems and Physical Exam were personally obtained by me. Portions of this information were initially documented by the CMA and reviewed by me for thoroughness and accuracy.     Mila Merry, MD [facility] [phone] (phone) [phone] (fax)  [facility]

## 2020-01-29 ENCOUNTER — Ambulatory Visit (INDEPENDENT_AMBULATORY_CARE_PROVIDER_SITE_OTHER): Payer: BC Managed Care – PPO | Admitting: Family Medicine

## 2020-01-29 DIAGNOSIS — B309 Viral conjunctivitis, unspecified: Secondary | ICD-10-CM

## 2020-01-29 DIAGNOSIS — Z720 Tobacco use: Secondary | ICD-10-CM

## 2020-01-29 DIAGNOSIS — F321 Major depressive disorder, single episode, moderate: Secondary | ICD-10-CM | POA: Insufficient documentation

## 2020-01-29 MED ORDER — BUPROPION HCL ER (SR) 150 MG PO TB12: ORAL_TABLET | ORAL | 5 refills | Status: DC

## 2020-01-29 MED ORDER — SERTRALINE HCL 50 MG PO TABS: 50.0000 mg | ORAL_TABLET | Freq: Every day | ORAL | 3 refills | Status: DC

## 2020-07-08 ENCOUNTER — Other Ambulatory Visit: Payer: Self-pay | Admitting: Family Medicine

## 2020-07-08 DIAGNOSIS — Z720 Tobacco use: Secondary | ICD-10-CM

## 2021-03-06 ENCOUNTER — Other Ambulatory Visit: Payer: Self-pay | Admitting: Family Medicine

## 2021-03-06 DIAGNOSIS — F321 Major depressive disorder, single episode, moderate: Secondary | ICD-10-CM

## 2021-03-06 NOTE — Telephone Encounter (Signed)
Requested medication (s) are due for refill today: expired medication  Requested medication (s) are on the active medication list: yes  Last refill:  01/29/20 #90 3 refills   Future visit scheduled: no  Notes to clinic:  expired medication. Last seen greater than 1 year. Called patient to schedule appt. No answer, LVMTCB. Do you want to give courtesy refill?     Requested Prescriptions  Pending Prescriptions Disp Refills   sertraline (ZOLOFT) 50 MG tablet [Pharmacy Med Name: SERTRALINE HCL 50 MG TABLET] 90 tablet 3    Sig: TAKE 1 TABLET BY MOUTH EVERY DAY      Psychiatry:  Antidepressants - SSRI Failed - 03/06/2021  1:15 PM      Failed - Completed PHQ-2 or PHQ-9 in the last 360 days      Failed - Valid encounter within last 6 months    Recent Outpatient Visits           1 year ago Current moderate episode of major depressive disorder without prior episode [facility])   [facility] Malva Limes, MD   2 years ago Acute viral syndrome   [facility] [place], [place]   2 years ago Annual physical exam   [facility] Malva Limes, MD   3 years ago Screening examination for STD (sexually transmitted disease)   [facility] Malva Limes, MD   3 years ago Current moderate episode of major depressive disorder without prior episode [facility])   [facility] Sherrie Mustache, Demetrios Isaacs, MD

## 2021-04-29 ENCOUNTER — Other Ambulatory Visit: Payer: Self-pay

## 2021-04-29 ENCOUNTER — Ambulatory Visit
Admission: RE | Admit: 2021-04-29 | Discharge: 2021-04-29 | Disposition: A | Discharge status: Home / Self Care | Payer: BC Managed Care – PPO | Source: Ambulatory Visit | Attending: Family Medicine | Admitting: Family Medicine

## 2021-04-29 VITALS — BP 148/81 | HR 74 | Temp 98.1°F | Resp 18

## 2021-04-29 DIAGNOSIS — H9202 Otalgia, left ear: Secondary | ICD-10-CM

## 2021-04-29 NOTE — Discharge Instructions (Addendum)
Take ibuprofen and plain Mucinex as directed.    Follow up with your primary care provider if your symptoms are not improving.

## 2021-04-29 NOTE — ED Provider Notes (Signed)
Renaldo Fiddler    CSN: [id number] Arrival date & time: 04/29/21  0947      History   Chief Complaint Chief Complaint  Patient presents with   Otalgia    HPI Kenneth Kersh. is a 50 y.o. male.  Patient presents with 4-week history of intermittent left ear pain.  He denies fever, chills, ear drainage, pain with movement, change in hearing, sore throat, cough, or other symptoms.  No treatments attempted at home.  The history is provided by the patient and medical records.   Past Medical History:  Diagnosis Date   Allergy     Patient Active Problem List   Diagnosis Date Noted   Current moderate episode of major depressive disorder without prior episode (HCC) 01/29/2020   NO HISTORY OF CHICKEN POX 04/04/2018   Tobacco use 04/04/2018   Depression 02/15/2017    Past Surgical History:  Procedure Laterality Date   GALLBLADDER SURGERY  2013   TESTICLE SURGERY         Home Medications    Prior to Admission medications   Medication Sig Start Date End Date Taking? Authorizing Provider  buPROPion (WELLBUTRIN SR) 150 MG 12 hr tablet TAKE 1 TABLET BY MOUTH EVERY DAY FOR 3 DAYS, THEN 1 TAB TWICE A DAY. STOP SMOKING 14 DAYS AFTER MEDICATION 07/08/20   Malva Limes, MD  loratadine (CLARITIN) 10 MG tablet Take 10 mg by mouth daily.    [provider]  sertraline (ZOLOFT) 50 MG tablet TAKE 1 TABLET BY MOUTH EVERY DAY 03/07/21   Malva Limes, MD    Family History Family History  Problem Relation Age of Onset   Cancer Mother        Breast and stomach cancer   Mental illness Other        grandparent   Stroke Other        grandparent   Diabetes Other        grandparent   Cancer Other        grandparent    Social History Social History   Tobacco Use   Smoking status: Every Day    Packs/day: 1.00    Types: Cigarettes   Smokeless tobacco: Never   Tobacco comments:    started age 44.  Vaping Use   Vaping Use: Every day  Substance Use  Topics   Alcohol use: Yes    Alcohol/week: 6.0 - 12.0 standard drinks    Types: 6 - 12 Cans of beer per week   Drug use: No     Allergies   Other   Review of Systems Review of Systems  Constitutional:  Negative for chills and fever.  HENT:  Positive for ear pain. Negative for congestion, ear discharge, hearing loss and sore throat.   Respiratory:  Negative for cough and shortness of breath.   Cardiovascular:  Negative for chest pain and palpitations.  Gastrointestinal:  Negative for abdominal pain and vomiting.  Skin:  Negative for color change and rash.  All other systems reviewed and are negative.   Physical Exam Triage Vital Signs ED Triage Vitals  Enc Vitals Group     BP      Pulse      Resp      Temp      Temp src      SpO2      Weight      Height      Head Circumference      Peak  Flow      Pain Score      Pain Loc      Pain Edu?      Excl. in GC?    No data found.  Updated Vital Signs BP (!) 148/81 (BP Location: Left Arm)   Pulse 74   Temp 98.1 F (36.7 C) (Oral)   Resp 18   SpO2 97%   Visual Acuity Right Eye Distance:   Left Eye Distance:   Bilateral Distance:    Right Eye Near:   Left Eye Near:    Bilateral Near:     Physical Exam Vitals and nursing note reviewed.  Constitutional:      General: He is not in acute distress.    Appearance: He is well-developed. He is not ill-appearing.  HENT:     Head: Normocephalic and atraumatic.     Right Ear: Tympanic membrane and ear canal normal. There is no impacted cerumen.     Left Ear: Tympanic membrane and ear canal normal. There is no impacted cerumen.     Nose: Nose normal.     Mouth/Throat:     Mouth: Mucous membranes are moist.     Pharynx: Oropharynx is clear.  Eyes:     Conjunctiva/sclera: Conjunctivae normal.  Cardiovascular:     Rate and Rhythm: Normal rate and regular rhythm.     Heart sounds: Normal heart sounds.  Pulmonary:     Effort: Pulmonary effort is normal. No  respiratory distress.     Breath sounds: Normal breath sounds.  Abdominal:     Palpations: Abdomen is soft.     Tenderness: There is no abdominal tenderness.  Musculoskeletal:     Cervical back: Neck supple.  Skin:    General: Skin is warm and dry.  Neurological:     General: No focal deficit present.     Mental Status: He is alert and oriented to person, place, and time.     Gait: Gait normal.  Psychiatric:        Mood and Affect: Mood normal.        Behavior: Behavior normal.     UC Treatments / Results  Labs (all labs ordered are listed, but only abnormal results are displayed) Labs Reviewed - No data to display  EKG   Radiology No results found.  Procedures Procedures (including critical care time)  Medications Ordered in UC Medications - No data to display  Initial Impression / Assessment and Plan / UC Course  I have reviewed the triage vital signs and the nursing notes.  Pertinent labs & imaging results that were available during my care of the patient were reviewed by me and considered in my medical decision making (see chart for details).   Left otalgia.  Discussed symptomatic treatment including ibuprofen and plain Mucinex.  Education provided on otalgia and eustachian tube dysfunction.  Instructed patient to follow-up with his PCP or an ENT if his symptoms are not improving.  He agrees to plan of care.   Final Clinical Impressions(s) / UC Diagnoses   Final diagnoses:  Acute otalgia, left     Discharge Instructions      Take ibuprofen and plain Mucinex as directed.    Follow up with your primary care provider if your symptoms are not improving.         ED Prescriptions   None    PDMP not reviewed this encounter.   Mickie Bail, NP 04/29/21 1024

## 2021-04-29 NOTE — ED Triage Notes (Signed)
Pt here with intermittent left ear pain and pressure for 1 month. No other sx and no loss of hearing.

## 2021-06-06 ENCOUNTER — Other Ambulatory Visit: Payer: Self-pay | Admitting: Family Medicine

## 2021-06-06 DIAGNOSIS — F321 Major depressive disorder, single episode, moderate: Secondary | ICD-10-CM

## 2021-09-07 ENCOUNTER — Other Ambulatory Visit: Payer: Self-pay | Admitting: Family Medicine

## 2021-09-07 DIAGNOSIS — F321 Major depressive disorder, single episode, moderate: Secondary | ICD-10-CM

## 2021-09-07 NOTE — Telephone Encounter (Signed)
Called patient to scheduled future appt. For further refills. No answer, LMTCB [phone].

## 2021-09-07 NOTE — Telephone Encounter (Signed)
Requested medication (s) are due for refill today: yes overdue encounter   Requested medication (s) are on the active medication list: yes  Last refill:  06/06/21 #90 0 refills  Future visit scheduled: no  Notes to clinic:  ov er due visit. Do you want to give courtesy refill? Called patient to schedule appt. No answer, lmtcb     Requested Prescriptions  Pending Prescriptions Disp Refills   sertraline (ZOLOFT) 50 MG tablet [Pharmacy Med Name: SERTRALINE HCL 50 MG TABLET] 90 tablet 0    Sig: TAKE 1 TABLET BY MOUTH EVERY DAY     Psychiatry:  Antidepressants - SSRI Failed - 09/07/2021 10:15 AM      Failed - Completed PHQ-2 or PHQ-9 in the last 360 days      Failed - Valid encounter within last 6 months    Recent Outpatient Visits           1 year ago Current moderate episode of major depressive disorder without prior episode [facility])   [facility] Malva Limes, MD   2 years ago Acute viral syndrome   [facility] [place], [place], [place]   3 years ago Annual physical exam   [facility] Malva Limes, MD   3 years ago Screening examination for STD (sexually transmitted disease)   [facility] Malva Limes, MD   3 years ago Current moderate episode of major depressive disorder without prior episode [facility])   [facility] Sherrie Mustache, Demetrios Isaacs, MD

## 2021-11-24 ENCOUNTER — Ambulatory Visit
Admission: RE | Admit: 2021-11-24 | Discharge: 2021-11-24 | Disposition: A | Discharge status: Home / Self Care | Payer: BC Managed Care – PPO | Attending: Family Medicine | Admitting: Family Medicine

## 2021-11-24 ENCOUNTER — Ambulatory Visit (INDEPENDENT_AMBULATORY_CARE_PROVIDER_SITE_OTHER): Payer: BC Managed Care – PPO | Admitting: Family Medicine

## 2021-11-24 ENCOUNTER — Encounter: Payer: Self-pay | Admitting: Family Medicine

## 2021-11-24 ENCOUNTER — Ambulatory Visit
Admission: RE | Admit: 2021-11-24 | Discharge: 2021-11-24 | Disposition: A | Discharge status: Home / Self Care | Payer: BC Managed Care – PPO | Source: Ambulatory Visit | Attending: Family Medicine | Admitting: Family Medicine

## 2021-11-24 ENCOUNTER — Other Ambulatory Visit: Payer: Self-pay

## 2021-11-24 VITALS — BP 120/77 | HR 84 | Temp 98.5°F | Resp 16 | Ht 74.0 in | Wt 213.0 lb

## 2021-11-24 DIAGNOSIS — F321 Major depressive disorder, single episode, moderate: Secondary | ICD-10-CM | POA: Diagnosis not present

## 2021-11-24 DIAGNOSIS — G8929 Other chronic pain: Secondary | ICD-10-CM

## 2021-11-24 DIAGNOSIS — M25562 Pain in left knee: Secondary | ICD-10-CM

## 2021-11-24 MED ORDER — MELOXICAM 15 MG PO TABS: 15.0000 mg | ORAL_TABLET | Freq: Every day | ORAL | 0 refills | Status: AC

## 2021-11-24 NOTE — Progress Notes (Signed)
?  ? ?I,Roshena L Chambers,acting as a scribe for Mila Merry, MD.,have documented all relevant documentation on the behalf of Mila Merry, MD,as directed by  Mila Merry, MD while in the presence of Mila Merry, MD.  ? ? ?Established patient visit ? ? ?Patient: Kenneth Lowery.   DOB: 1971-06-16   51 y.o. Male  MRN: [id number] ?Visit Date: 11/24/2021 ? ?Today's healthcare provider: Mila Merry, MD  ? ?Chief Complaint  ?Patient presents with  ? Knee Pain  ? Depression  ? ?Subjective  ?  ?Knee Pain  ?The pain is present in the left knee. The quality of the pain is described as burning and aching (also sharp pain at times). The pain is moderate. The pain has been Constant since onset. Associated symptoms include numbness (big toe on right foot). Pertinent negatives include no tingling. He has tried acetaminophen and NSAIDs for the symptoms. The treatment provided mild relief.   ?He reports having a left knee injury during childhood, but no recent injuries. Gets very stiff when sitting for prolonged. Loosens up a little when walking, but after awhile he starts limping. Has noticed a little swelling at times. He has prescription of 800mg  ibuprofen which hasn't really helped.  ? ?Depression, Follow-up ? ?He  was last seen for this 01/29/2020.   ?Changes made at last visit include none; continue same medication regiment. ?  ?He reports fair compliance with treatment.Patient stopped taking Wellbutrin due to it causing blurred vision  ?He is having side effects. (Blurred vision -resolved after stopping Wellbutrin) ? ?He reports good tolerance of treatment. ?Current symptoms include: depressed mood ?He feels he is Improved since last visit. ? ? ?  11/24/2021  ? 10:49 AM 01/28/2020  ?  4:15 PM 04/04/2018  ?  2:11 PM  ?Depression screen PHQ 2/9  ?Decreased Interest 1 1 1   ?Down, Depressed, Hopeless 1 1 1   ?PHQ - 2 Score 2 2 2   ?Altered sleeping 2 0 1  ?Tired, decreased energy 1 1 1   ?Change in appetite 2 1 1   ?Feeling  bad or failure about yourself  1 1 1   ?Trouble concentrating 2 1 2   ?Moving slowly or fidgety/restless 0 1 0  ?Suicidal thoughts 0 1 0  ?PHQ-9 Score 10 8 8   ?Difficult doing work/chores Somewhat difficult Somewhat difficult Not difficult at all  ?  ?-----------------------------------------------------------------------------------------  ? ?Medications: ?Outpatient Medications Prior to Visit  ?Medication Sig  ? loratadine (CLARITIN) 10 MG tablet Take 10 mg by mouth daily.  ? sertraline (ZOLOFT) 50 MG tablet TAKE 1 TABLET BY MOUTH EVERY DAY  ? buPROPion (WELLBUTRIN SR) 150 MG 12 hr tablet TAKE 1 TABLET BY MOUTH EVERY DAY FOR 3 DAYS, THEN 1 TAB TWICE A DAY. STOP SMOKING 14 DAYS AFTER MEDICATION (Patient not taking: Reported on 11/24/2021)  ? ?No facility-administered medications prior to visit.  ? ? ?Review of Systems  ?Constitutional:  Negative for appetite change, chills and fever.  ?Respiratory:  Negative for chest tightness, shortness of breath and wheezing.   ?Cardiovascular:  Negative for chest pain and palpitations.  ?Gastrointestinal:  Negative for abdominal pain, nausea and vomiting.  ?Neurological:  Positive for numbness (big toe on right foot). Negative for tingling.  ? ?  Objective  ?  ?BP 120/77 (BP Location: Right Arm, Patient Position: Sitting, Cuff Size: Large)   Pulse 84   Temp 98.5 ?F (36.9 ?C) (Oral)   Resp 16   Ht 6\' 2"  (1.88 m)   Wt 213  lb (96.6 kg)   SpO2 100% Comment: room air  BMI 27.35 kg/m?  ? ?Physical Exam  ? ?Slight tenderness left lateral joint line with subtle swelling. Positive McMrurray's sign.  ? Assessment & Plan  ?  ? ?1. Chronic pain of left knee ?Suspect meniscal injury.  ? ?- DG Knee Complete 4 Views Left; Future ? ?- meloxicam (MOBIC) 15 MG tablet; Take 1 tablet (15 mg total) by mouth daily.  Dispense: 30 tablet; Refill: 0  ? ?Recommend regular ice pack application and OTC knee brace.  ? ?Consider orthopedic referral.  ? ? ?2. Current moderate episode of major depressive  disorder without prior episode (HCC) ?Stopped bupropion and not  taking sertraline consistently, but feels he is doing well and does not wish to make any changes as this time.  ?   ?The entirety of the information documented in the History of Present Illness, Review of Systems and Physical Exam were personally obtained by me. Portions of this information were initially documented by the CMA and reviewed by me for thoroughness and accuracy.   ? ? ? ?Mila Merry, MD  ?[facility] ?[phone] (phone) ?[phone] (fax) ? ?[facility]  ?

## 2021-11-24 NOTE — Patient Instructions (Signed)
Please review the attached list of medications and notify my office if there are any errors.  ? ?You can tale Tylenol along with the prescription meloxicam, but don't take any OTC ibuprofen or naproxen with the meloxicam ? ?Apply an ice pack every 3-4 hours during the day. You can also use an OTC elastic knee brace. ?

## 2021-12-29 ENCOUNTER — Ambulatory Visit: Payer: Self-pay

## 2021-12-29 NOTE — Telephone Encounter (Signed)
?  Chief Complaint: knee pain ?Symptoms: L knee pain 1/10, dull pain then sharp ache ?Frequency: several weeks ?Pertinent Negatives: Patient denies swelling or redness to knee ?Disposition: [] ED /[] Urgent Care (no appt availability in office) / [x] Appointment(In office/virtual)/ []  [place] [facility]/ [] Home Care/ [] Refused Recommended Disposition /[]  Mobile Bus/ []  Follow-up with PCP ?Additional Notes: due to pt's work schedule, scheduled VV on 12/31/21 with PCP to f/up on knee pain. Pt wants something different for pain as well since the meloxicam gave him weird SE. Advised him that Dr. should be able to recommend something different for pain and set up ortho referral as well. Pt verbalized understanding.  ? ?Summary: knee pain is back  ? Pt saw the dr on 3/29 for knee pain.  Pt was prescribed mobic.  Pain went away. Pt finished medication.  Pain is back again, same pain as before.  Pt does not want to take the mobic again, it gave him some "unsightly" side effects. He said Dr said something about referral to ortho if his pain came back.  ?  ? ?Reason for Disposition ? Knee pain is a chronic symptom (recurrent or ongoing AND present > 4 weeks) ? ?Answer Assessment - Initial Assessment Questions ?1. LOCATION and RADIATION: "Where is the pain located?"  ?    L knee ?2. QUALITY: "What does the pain feel like?"  (e.g., sharp, dull, aching, burning) ?    Dull ache then turns to sharp pain ?3. SEVERITY: "How bad is the pain?" "What does it keep you from doing?"   (Scale 1-10; or mild, moderate, severe) ?  -  MILD (1-3): doesn't interfere with normal activities  ?  -  MODERATE (4-7): interferes with normal activities (e.g., work or school) or awakens from sleep, limping  ?  -  SEVERE (8-10): excruciating pain, unable to do any normal activities, unable to walk ?    1 ?4. ONSET: "When did the pain start?" "Does it come and go, or is it there all the time?" ?    Several weeks  ?8. ASSOCIATED  SYMPTOMS: "Is there any swelling or redness of the knee?" ?    No ?9. OTHER SYMPTOMS: "Do you have any other symptoms?" (e.g., chest pain, difficulty breathing, fever, calf pain) ?    No ? ?Protocols used: Knee Pain-A-AH ? ?

## 2021-12-29 NOTE — Progress Notes (Signed)
MyChart Video Visit    Virtual Visit via Video Note   This visit type was conducted due to national recommendations for restrictions regarding the COVID-19 Pandemic (e.g. social distancing) in an effort to limit this patient's exposure and mitigate transmission in our community. This patient is at least at moderate risk for complications without adequate follow up. This format is felt to be most appropriate for this patient at this time. Physical exam was limited by quality of the video and audio technology used for the visit.   Patient location: work Provider location: [facility]  I discussed the limitations of evaluation and management by telemedicine and the availability of in person appointments. The patient expressed understanding and agreed to proceed.  Patient: Kenneth Lowery.   DOB: 11/12/1970   50 y.o. Male  MRN: [id number] Visit Date: 12/31/2021  Today's healthcare provider: Mila Merry, MD   Chief Complaint  Patient presents with   Knee Pain   Subjective    Knee Pain    Patient was seen in 11/24/2021 for chronic pain of left knee. During that visit, patient was prescribed Meloxicam 15mg  daily. Xray was ordered showing calcium deposits in cartilage of knee. Patient was advised to continue meloxicam that was prescribed. If not much better in 3-4 weeks then call for orthopedic referral.  Patient reports good compliance with treatment. He states his knee pain resolved while taking  Meloxicam, but returned 3-4 days after stopping it. He also reports having side effects of anal itching while taking Meloxicam.    Medications: Outpatient Medications Prior to Visit  Medication Sig   loratadine (CLARITIN) 10 MG tablet Take 10 mg by mouth daily.   sertraline (ZOLOFT) 50 MG tablet TAKE 1 TABLET BY MOUTH EVERY DAY   No facility-administered medications prior to visit.    Review of Systems  Constitutional:  Negative for appetite change, chills and fever.  Respiratory:   Negative for chest tightness, shortness of breath and wheezing.   Cardiovascular:  Negative for chest pain and palpitations.  Gastrointestinal:  Negative for abdominal pain, nausea and vomiting.  Musculoskeletal:  Positive for arthralgias (left knee pain).      Objective    There were no vitals taken for this visit.    Physical Exam   Awake, alert, oriented x 3. In no apparent distress   Assessment & Plan     1. Chronic pain of left knee   2. Chondrocalcinosis of left knee  Meloxicam was very effective for control of pain but caused some issues with anal itching. Will try celecoxib (CELEBREX) 200 MG capsule; Take 1 capsule (200 mg total) by mouth 2 (two) times daily as needed (knee pain).  Dispense: 60 capsule; Refill: 3   Advised to take every night on a schedule, and one during the day only as needed.      I discussed the assessment and treatment plan with the patient. The patient was provided an opportunity to ask questions and all were answered. The patient agreed with the plan and demonstrated an understanding of the instructions.   The patient was advised to call back or seek an in-person evaluation if the symptoms worsen or if the condition fails to improve as anticipated.  I provided 8 minutes of non-face-to-face time during this encounter.  The entirety of the information documented in the History of Present Illness, Review of Systems and Physical Exam were personally obtained by me. Portions of this information were initially documented by the CMA and  reviewed by me for thoroughness and accuracy.    Mila Merry, MD [facility] [phone] (phone) [phone] (fax)  [facility]

## 2021-12-31 ENCOUNTER — Telehealth (INDEPENDENT_AMBULATORY_CARE_PROVIDER_SITE_OTHER): Payer: BC Managed Care – PPO | Admitting: Family Medicine

## 2021-12-31 ENCOUNTER — Encounter: Payer: Self-pay | Admitting: Family Medicine

## 2021-12-31 DIAGNOSIS — M25562 Pain in left knee: Secondary | ICD-10-CM | POA: Diagnosis not present

## 2021-12-31 DIAGNOSIS — M11262 Other chondrocalcinosis, left knee: Secondary | ICD-10-CM | POA: Diagnosis not present

## 2021-12-31 DIAGNOSIS — G8929 Other chronic pain: Secondary | ICD-10-CM | POA: Insufficient documentation

## 2021-12-31 MED ORDER — CELECOXIB 200 MG PO CAPS: 200.0000 mg | ORAL_CAPSULE | Freq: Two times a day (BID) | ORAL | 3 refills | Status: AC | PRN

## 2022-01-05 ENCOUNTER — Ambulatory Visit: Payer: BC Managed Care – PPO | Admitting: Family Medicine

## 2022-02-16 ENCOUNTER — Other Ambulatory Visit: Payer: Self-pay | Admitting: Family Medicine

## 2022-02-16 DIAGNOSIS — F321 Major depressive disorder, single episode, moderate: Secondary | ICD-10-CM

## 2022-02-17 MED ORDER — SERTRALINE HCL 50 MG PO TABS: 50.0000 mg | ORAL_TABLET | Freq: Every day | ORAL | 0 refills | Status: DC

## 2022-03-23 ENCOUNTER — Encounter: Payer: Self-pay | Admitting: Family Medicine

## 2022-05-22 ENCOUNTER — Other Ambulatory Visit: Payer: Self-pay | Admitting: Family Medicine

## 2022-05-22 DIAGNOSIS — F321 Major depressive disorder, single episode, moderate: Secondary | ICD-10-CM

## 2023-08-09 ENCOUNTER — Ambulatory Visit: Payer: Self-pay | Admitting: Family Medicine
